# Patient Record
Sex: Male | Born: 1945 | Hispanic: No | Marital: Married | Smoking: Former smoker
Health system: Southern US, Community
[De-identification: ages and names within clinical notes are randomized; demographics above are authoritative.]

## PROBLEM LIST (undated history)

## (undated) DIAGNOSIS — R0600 Dyspnea, unspecified: Secondary | ICD-10-CM

---

## 2009-01-20 ENCOUNTER — Ambulatory Visit: Payer: Self-pay | Admitting: Internal Medicine

## 2009-02-11 ENCOUNTER — Ambulatory Visit: Payer: Self-pay | Admitting: Internal Medicine

## 2009-02-20 ENCOUNTER — Ambulatory Visit: Payer: Self-pay | Admitting: Internal Medicine

## 2009-03-22 ENCOUNTER — Ambulatory Visit: Payer: Self-pay | Admitting: Internal Medicine

## 2013-07-30 ENCOUNTER — Emergency Department: Payer: Self-pay | Admitting: Emergency Medicine

## 2013-07-30 LAB — CBC
HCT: 42 % (ref 40.0–52.0)
HGB: 14.6 g/dL (ref 13.0–18.0)
Platelet: 151 10*3/uL (ref 150–440)
RBC: 4.58 10*6/uL (ref 4.40–5.90)
WBC: 2.7 10*3/uL — ABNORMAL LOW (ref 3.8–10.6)

## 2013-07-30 LAB — BASIC METABOLIC PANEL
BUN: 12 mg/dL (ref 7–18)
Calcium, Total: 8.8 mg/dL (ref 8.5–10.1)
Co2: 30 mmol/L (ref 21–32)
EGFR (African American): >60
EGFR (Non-African Amer.): >60
Glucose: 121 mg/dL — ABNORMAL HIGH (ref 65–99)
Osmolality: 279 (ref 275–301)

## 2016-07-20 ENCOUNTER — Other Ambulatory Visit: Payer: Self-pay | Admitting: Family Medicine

## 2016-07-20 DIAGNOSIS — Z136 Encounter for screening for cardiovascular disorders: Secondary | ICD-10-CM

## 2016-07-27 ENCOUNTER — Ambulatory Visit: Payer: 59

## 2017-12-29 ENCOUNTER — Other Ambulatory Visit: Payer: Self-pay | Admitting: Family Medicine

## 2017-12-29 DIAGNOSIS — Z136 Encounter for screening for cardiovascular disorders: Secondary | ICD-10-CM

## 2020-09-11 ENCOUNTER — Other Ambulatory Visit: Payer: Self-pay

## 2020-09-11 ENCOUNTER — Other Ambulatory Visit (HOSPITAL_COMMUNITY): Payer: Self-pay | Admitting: Family Medicine

## 2020-09-11 ENCOUNTER — Other Ambulatory Visit: Payer: Self-pay | Admitting: Family Medicine

## 2020-09-11 ENCOUNTER — Ambulatory Visit
Admission: RE | Admit: 2020-09-11 | Discharge: 2020-09-11 | Disposition: A | Discharge status: Home / Self Care | Payer: BC Managed Care – PPO | Source: Ambulatory Visit | Attending: Family Medicine | Admitting: Family Medicine

## 2020-09-11 DIAGNOSIS — R918 Other nonspecific abnormal finding of lung field: Secondary | ICD-10-CM | POA: Diagnosis not present

## 2020-11-13 DIAGNOSIS — I209 Angina pectoris, unspecified: Secondary | ICD-10-CM | POA: Diagnosis present

## 2020-11-14 ENCOUNTER — Other Ambulatory Visit: Payer: BC Managed Care – PPO

## 2020-11-17 ENCOUNTER — Other Ambulatory Visit
Admission: RE | Admit: 2020-11-17 | Discharge: 2020-11-17 | Disposition: A | Discharge status: Home / Self Care | Payer: BC Managed Care – PPO | Source: Ambulatory Visit | Attending: Internal Medicine | Admitting: Internal Medicine

## 2020-11-17 ENCOUNTER — Other Ambulatory Visit: Payer: Self-pay

## 2020-11-17 DIAGNOSIS — Z79899 Other long term (current) drug therapy: Secondary | ICD-10-CM | POA: Diagnosis not present

## 2020-11-17 DIAGNOSIS — I2510 Atherosclerotic heart disease of native coronary artery without angina pectoris: Secondary | ICD-10-CM | POA: Diagnosis not present

## 2020-11-17 DIAGNOSIS — I110 Hypertensive heart disease with heart failure: Secondary | ICD-10-CM | POA: Diagnosis not present

## 2020-11-17 DIAGNOSIS — E7800 Pure hypercholesterolemia, unspecified: Secondary | ICD-10-CM | POA: Diagnosis not present

## 2020-11-17 DIAGNOSIS — Z01812 Encounter for preprocedural laboratory examination: Secondary | ICD-10-CM | POA: Insufficient documentation

## 2020-11-17 DIAGNOSIS — Z20822 Contact with and (suspected) exposure to covid-19: Secondary | ICD-10-CM | POA: Insufficient documentation

## 2020-11-17 DIAGNOSIS — E785 Hyperlipidemia, unspecified: Secondary | ICD-10-CM | POA: Diagnosis not present

## 2020-11-17 DIAGNOSIS — I420 Dilated cardiomyopathy: Secondary | ICD-10-CM | POA: Diagnosis not present

## 2020-11-17 DIAGNOSIS — I447 Left bundle-branch block, unspecified: Secondary | ICD-10-CM | POA: Diagnosis not present

## 2020-11-17 DIAGNOSIS — I5022 Chronic systolic (congestive) heart failure: Secondary | ICD-10-CM | POA: Diagnosis not present

## 2020-11-17 DIAGNOSIS — R0602 Shortness of breath: Secondary | ICD-10-CM | POA: Diagnosis not present

## 2020-11-17 DIAGNOSIS — Z87891 Personal history of nicotine dependence: Secondary | ICD-10-CM | POA: Diagnosis not present

## 2020-11-17 LAB — SARS CORONAVIRUS 2 (TAT 6-24 HRS): SARS Coronavirus 2: NEGATIVE

## 2020-11-18 ENCOUNTER — Ambulatory Visit
Admission: RE | Admit: 2020-11-18 | Discharge: 2020-11-18 | Disposition: A | Discharge status: Home / Self Care | Payer: BC Managed Care – PPO | Attending: Internal Medicine | Admitting: Internal Medicine

## 2020-11-18 ENCOUNTER — Encounter: Payer: Self-pay | Admitting: Internal Medicine

## 2020-11-18 ENCOUNTER — Encounter
Admission: RE | Disposition: A | Discharge status: Home / Self Care | Payer: Self-pay | Source: Home / Self Care | Attending: Internal Medicine

## 2020-11-18 ENCOUNTER — Other Ambulatory Visit: Payer: Self-pay

## 2020-11-18 DIAGNOSIS — I209 Angina pectoris, unspecified: Secondary | ICD-10-CM | POA: Diagnosis present

## 2020-11-18 DIAGNOSIS — I2510 Atherosclerotic heart disease of native coronary artery without angina pectoris: Secondary | ICD-10-CM | POA: Diagnosis not present

## 2020-11-18 DIAGNOSIS — R079 Chest pain, unspecified: Secondary | ICD-10-CM

## 2020-11-18 DIAGNOSIS — I110 Hypertensive heart disease with heart failure: Secondary | ICD-10-CM | POA: Insufficient documentation

## 2020-11-18 DIAGNOSIS — R0602 Shortness of breath: Secondary | ICD-10-CM | POA: Insufficient documentation

## 2020-11-18 DIAGNOSIS — Z20822 Contact with and (suspected) exposure to covid-19: Secondary | ICD-10-CM | POA: Insufficient documentation

## 2020-11-18 DIAGNOSIS — Z87891 Personal history of nicotine dependence: Secondary | ICD-10-CM | POA: Insufficient documentation

## 2020-11-18 DIAGNOSIS — Z79899 Other long term (current) drug therapy: Secondary | ICD-10-CM | POA: Insufficient documentation

## 2020-11-18 DIAGNOSIS — I420 Dilated cardiomyopathy: Secondary | ICD-10-CM | POA: Insufficient documentation

## 2020-11-18 DIAGNOSIS — E785 Hyperlipidemia, unspecified: Secondary | ICD-10-CM | POA: Insufficient documentation

## 2020-11-18 DIAGNOSIS — I5022 Chronic systolic (congestive) heart failure: Secondary | ICD-10-CM | POA: Insufficient documentation

## 2020-11-18 DIAGNOSIS — I447 Left bundle-branch block, unspecified: Secondary | ICD-10-CM | POA: Insufficient documentation

## 2020-11-18 DIAGNOSIS — R9430 Abnormal result of cardiovascular function study, unspecified: Secondary | ICD-10-CM

## 2020-11-18 DIAGNOSIS — E7800 Pure hypercholesterolemia, unspecified: Secondary | ICD-10-CM | POA: Insufficient documentation

## 2020-11-18 HISTORY — DX: Dyspnea, unspecified: R06.00

## 2020-11-18 HISTORY — PX: LEFT HEART CATH AND CORONARY ANGIOGRAPHY: CATH118249

## 2020-11-18 SURGERY — LEFT HEART CATH AND CORONARY ANGIOGRAPHY
Anesthesia: Moderate Sedation | Laterality: Left

## 2020-11-18 MED ORDER — LIDOCAINE HCL (PF) 1 % IJ SOLN
INTRAMUSCULAR | Status: AC
  Filled 2020-11-18: qty 30

## 2020-11-18 MED ORDER — HEPARIN (PORCINE) IN NACL 1000-0.9 UT/500ML-% IV SOLN
INTRAVENOUS | Status: DC | PRN
  Administered 2020-11-18: 500 mL

## 2020-11-18 MED ORDER — LIDOCAINE HCL (PF) 1 % IJ SOLN
INTRAMUSCULAR | Status: DC | PRN
  Administered 2020-11-18: 2 mL

## 2020-11-18 MED ORDER — VERAPAMIL HCL 2.5 MG/ML IV SOLN
INTRAVENOUS | Status: AC
  Filled 2020-11-18: qty 2

## 2020-11-18 MED ORDER — FENTANYL CITRATE (PF) 100 MCG/2ML IJ SOLN
INTRAMUSCULAR | Status: AC
  Filled 2020-11-18: qty 2

## 2020-11-18 MED ORDER — SODIUM CHLORIDE 0.9 % WEIGHT BASED INFUSION: 1.0000 mL/kg/h | INTRAVENOUS | Status: DC

## 2020-11-18 MED ORDER — MIDAZOLAM HCL 2 MG/2ML IJ SOLN
INTRAMUSCULAR | Status: DC | PRN
  Administered 2020-11-18 (×2): 1 mg via INTRAVENOUS

## 2020-11-18 MED ORDER — SODIUM CHLORIDE 0.9 % IV SOLN: 250.0000 mL | INTRAVENOUS | Status: DC | PRN

## 2020-11-18 MED ORDER — HEPARIN (PORCINE) IN NACL 1000-0.9 UT/500ML-% IV SOLN
INTRAVENOUS | Status: AC
  Filled 2020-11-18: qty 1000

## 2020-11-18 MED ORDER — HEPARIN SODIUM (PORCINE) 1000 UNIT/ML IJ SOLN
INTRAMUSCULAR | Status: AC
  Filled 2020-11-18: qty 1

## 2020-11-18 MED ORDER — SODIUM CHLORIDE 0.9% FLUSH: 3.0000 mL | INTRAVENOUS | Status: DC | PRN

## 2020-11-18 MED ORDER — IOHEXOL 300 MG/ML  SOLN
INTRAMUSCULAR | Status: DC | PRN
  Administered 2020-11-18: 13:00:00 100 mL

## 2020-11-18 MED ORDER — ASPIRIN 81 MG PO CHEW: 81.0000 mg | CHEWABLE_TABLET | ORAL | Status: DC

## 2020-11-18 MED ORDER — VERAPAMIL HCL 2.5 MG/ML IV SOLN
INTRAVENOUS | Status: DC | PRN
  Administered 2020-11-18: 2.5 mg via INTRA_ARTERIAL

## 2020-11-18 MED ORDER — ONDANSETRON HCL 4 MG/2ML IJ SOLN: 4.0000 mg | Freq: Four times a day (QID) | INTRAMUSCULAR | Status: DC | PRN

## 2020-11-18 MED ORDER — MIDAZOLAM HCL 2 MG/2ML IJ SOLN
INTRAMUSCULAR | Status: AC
  Filled 2020-11-18: qty 2

## 2020-11-18 MED ORDER — LABETALOL HCL 5 MG/ML IV SOLN: 10.0000 mg | INTRAVENOUS | Status: DC | PRN

## 2020-11-18 MED ORDER — FENTANYL CITRATE (PF) 100 MCG/2ML IJ SOLN
INTRAMUSCULAR | Status: DC | PRN
Start: 2020-11-18 — End: 2020-11-18
  Administered 2020-11-18: 25 ug via INTRAVENOUS

## 2020-11-18 MED ORDER — SODIUM CHLORIDE 0.9% FLUSH: 3.0000 mL | Freq: Two times a day (BID) | INTRAVENOUS | Status: DC

## 2020-11-18 MED ORDER — ACETAMINOPHEN 325 MG PO TABS: 650.0000 mg | ORAL_TABLET | ORAL | Status: DC | PRN

## 2020-11-18 MED ORDER — HYDRALAZINE HCL 20 MG/ML IJ SOLN: 10.0000 mg | INTRAMUSCULAR | Status: DC | PRN

## 2020-11-18 MED ORDER — HEPARIN SODIUM (PORCINE) 1000 UNIT/ML IJ SOLN
INTRAMUSCULAR | Status: DC | PRN
  Administered 2020-11-18: 2500 [IU] via INTRAVENOUS

## 2020-11-18 MED ORDER — SODIUM CHLORIDE 0.9 % WEIGHT BASED INFUSION
3.0000 mL/kg/h | INTRAVENOUS | Status: AC
  Administered 2020-11-18: 12:00:00 3 mL/kg/h via INTRAVENOUS

## 2020-11-18 MED ORDER — ASPIRIN 81 MG PO CHEW
CHEWABLE_TABLET | ORAL | Status: AC
  Filled 2020-11-18: qty 1

## 2020-11-18 SURGICAL SUPPLY — 9 items
CATH INFINITI 5 FR JL3.5 (CATHETERS) ×3 IMPLANT
CATH INFINITI JR4 5F (CATHETERS) ×3 IMPLANT
DEVICE RAD TR BAND REGULAR (VASCULAR PRODUCTS) ×6 IMPLANT
GLIDESHEATH SLEND SS 6F .021 (SHEATH) ×3 IMPLANT
GUIDEWIRE INQWIRE 1.5J.035X260 (WIRE) ×1 IMPLANT
INQWIRE 1.5J .035X260CM (WIRE) ×3
KIT MANI 3VAL PERCEP (MISCELLANEOUS) ×3 IMPLANT
PACK CARDIAC CATH (CUSTOM PROCEDURE TRAY) ×3 IMPLANT
WIRE HITORQ VERSACORE ST 145CM (WIRE) ×3 IMPLANT

## 2020-11-18 NOTE — Discharge Instructions (Signed)
Radial Site Care  This sheet gives you information about how to care for yourself after your procedure. Your health care provider may also give you more specific instructions. If you have problems or questions, contact your health care provider. What can I expect after the procedure? After the procedure, it is common to have:  Bruising and tenderness at the catheter insertion area. Follow these instructions at home: Medicines  Take over-the-counter and prescription medicines only as told by your health care provider. Insertion site care  Follow instructions from your health care provider about how to take care of your insertion site. Make sure you: ? Wash your hands with soap and water before you change your bandage (dressing). If soap and water are not available, use hand sanitizer. ? Change your dressing as told by your health care provider. ? Leave stitches (sutures), skin glue, or adhesive strips in place. These skin closures may need to stay in place for 2 weeks or longer. If adhesive strip edges start to loosen and curl up, you may trim the loose edges. Do not remove adhesive strips completely unless your health care provider tells you to do that.  Check your insertion site every day for signs of infection. Check for: ? Redness, swelling, or pain. ? Fluid or blood. ? Pus or a bad smell. ? Warmth.  Do not take baths, swim, or use a hot tub until your health care provider approves.  You may shower 24-48 hours after the procedure, or as directed by your health care provider. ? Remove the dressing and gently wash the site with plain soap and water. ? Pat the area dry with a clean towel. ? Do not rub the site. That could cause bleeding.  Do not apply powder or lotion to the site. Activity   For 24 hours after the procedure, or as directed by your health care provider: ? Do not flex or bend the affected arm. ? Do not push or pull heavy objects with the affected arm. ? Do not  drive yourself home from the hospital or clinic. You may drive 24 hours after the procedure unless your health care provider tells you not to. ? Do not operate machinery or power tools.  Do not lift anything that is heavier than 10 lb (4.5 kg), or the limit that you are told, until your health care provider says that it is safe.  Ask your health care provider when it is okay to: ? Return to work or school. ? Resume usual physical activities or sports. ? Resume sexual activity. General instructions  If the catheter site starts to bleed, raise your arm and put firm pressure on the site. If the bleeding does not stop, get help right away. This is a medical emergency.  If you went home on the same day as your procedure, a responsible adult should be with you for the first 24 hours after you arrive home.  Keep all follow-up visits as told by your health care provider. This is important. Contact a health care provider if:  You have a fever.  You have redness, swelling, or yellow drainage around your insertion site. Get help right away if:  You have unusual pain at the radial site.  The catheter insertion area swells very fast.  The insertion area is bleeding, and the bleeding does not stop when you hold steady pressure on the area.  Your arm or hand becomes pale, cool, tingly, or numb. These symptoms may represent a serious problem   that is an emergency. Do not wait to see if the symptoms will go away. Get medical help right away. Call your local emergency services (911 in the [place] Do not drive yourself to the hospital. Summary  After the procedure, it is common to have bruising and tenderness at the site.  Follow instructions from your health care provider about how to take care of your radial site wound. Check the wound every day for signs of infection.  Do not lift anything that is heavier than 10 lb (4.5 kg), or the limit that you are told, until your health care provider says  that it is safe. This information is not intended to replace advice given to you by your health care provider. Make sure you discuss any questions you have with your health care provider. Document Revised: 12/14/2017 Document Reviewed: 12/14/2017 Elsevier Patient Education  2020 [employer] Angiogram, Care After This sheet gives you information about how to care for yourself after your procedure. Your health care provider may also give you more specific instructions. If you have problems or questions, contact your health care provider. What can I expect after the procedure? After the procedure, it is common to have bruising and tenderness at the catheter insertion area. Follow these instructions at home: Insertion site care  Follow instructions from your health care provider about how to take care of your insertion site. Make sure you: ? Wash your hands with soap and water before you change your bandage (dressing). If soap and water are not available, use hand sanitizer. ? Change your dressing as told by your health care provider. ? Leave stitches (sutures), skin glue, or adhesive strips in place. These skin closures may need to stay in place for 2 weeks or longer. If adhesive strip edges start to loosen and curl up, you may trim the loose edges. Do not remove adhesive strips completely unless your health care provider tells you to do that.  Do not take baths, swim, or use a hot tub until your health care provider approves.  You may shower 24-48 hours after the procedure or as told by your health care provider. ? Gently wash the site with plain soap and water. ? Pat the area dry with a clean towel. ? Do not rub the site. This may cause bleeding.  Do not apply powder or lotion to the site. Keep the site clean and dry.  Check your insertion site every day for signs of infection. Check for: ? Redness, swelling, or pain. ? Fluid or blood. ? Warmth. ? Pus or a bad smell. Activity  Rest  as told by your health care provider, usually for 1-2 days.  Do not lift anything that is heavier than 10 lbs. (4.5 kg) or as told by your health care provider.  Do not drive for 24 hours if you were given a medicine to help you relax (sedative).  Do not drive or use heavy machinery while taking prescription pain medicine. General instructions   Return to your normal activities as told by your health care provider, usually in about a week. Ask your health care provider what activities are safe for you.  If the catheter site starts bleeding, lie flat and put pressure on the site. If the bleeding does not stop, get help right away. This is a medical emergency.  Drink enough fluid to keep your urine clear or pale yellow. This helps flush the contrast dye from your body.  Take over-the-counter and prescription medicines only   as told by your health care provider.  Keep all follow-up visits as told by your health care provider. This is important. Contact a health care provider if:  You have a fever or chills.  You have redness, swelling, or pain around your insertion site.  You have fluid or blood coming from your insertion site.  The insertion site feels warm to the touch.  You have pus or a bad smell coming from your insertion site.  You have bruising around the insertion site.  You notice blood collecting in the tissue around the catheter site (hematoma). The hematoma may be painful to the touch. Get help right away if:  You have severe pain at the catheter insertion area.  The catheter insertion area swells very fast.  The catheter insertion area is bleeding, and the bleeding does not stop when you hold steady pressure on the area.  The area near or just beyond the catheter insertion site becomes pale, cool, tingly, or numb. These symptoms may represent a serious problem that is an emergency. Do not wait to see if the symptoms will go away. Get medical help right away. Call  your local emergency services (911 in the [place] Do not drive yourself to the hospital. Summary  After the procedure, it is common to have bruising and tenderness at the catheter insertion area.  After the procedure, it is important to rest and drink plenty of fluids.  Do not take baths, swim, or use a hot tub until your health care provider says it is okay to do so. You may shower 24-48 hours after the procedure or as told by your health care provider.  If the catheter site starts bleeding, lie flat and put pressure on the site. If the bleeding does not stop, get help right away. This is a medical emergency. This information is not intended to replace advice given to you by your health care provider. Make sure you discuss any questions you have with your health care provider. Document Revised: 10/21/2017 Document Reviewed: 10/13/2016 Elsevier Patient Education  2020 [employer]   

## 2020-11-19 ENCOUNTER — Encounter: Payer: Self-pay | Admitting: Internal Medicine

## 2022-09-18 IMAGING — CT CT CHEST W/O CM
2 of 3 series · 15 of 36 positions shown, 18 images · non-contrast
Comparison: Chest radiographs dated 07/30/2013

CLINICAL DATA: Cough and hemoptysis. Fifty pack-year history of
smoking, quit approximately 8 years ago. Reported chest x-ray
showing an irregularly marginated nodule in the right lung. That
radiograph and report are not available comparison.

EXAM:
CT CHEST WITHOUT CONTRAST
TECHNIQUE: Multidetector CT imaging of the chest was performed following the
standard protocol without IV contrast.

[Series 2: thorax · axial · 0.59mm/px · z∈[-559,-287]mm · 12 of 160 slices shown, 15 images]
[im 12/160  mediastinal]
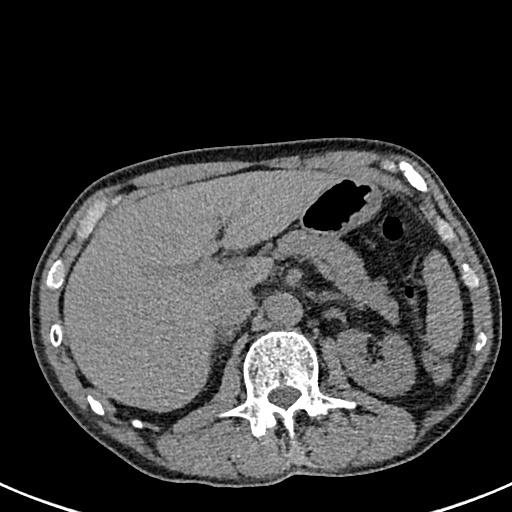
[im 12/160  lung]
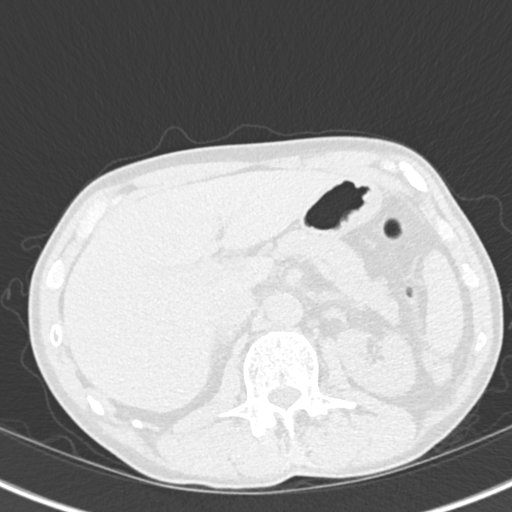
[im 24/160  lung]
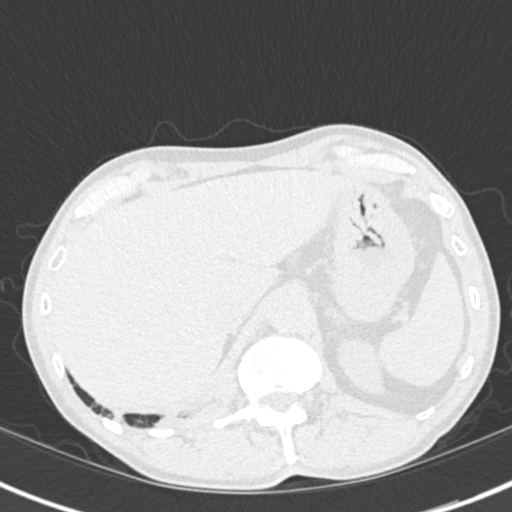
[im 36/160  lung]
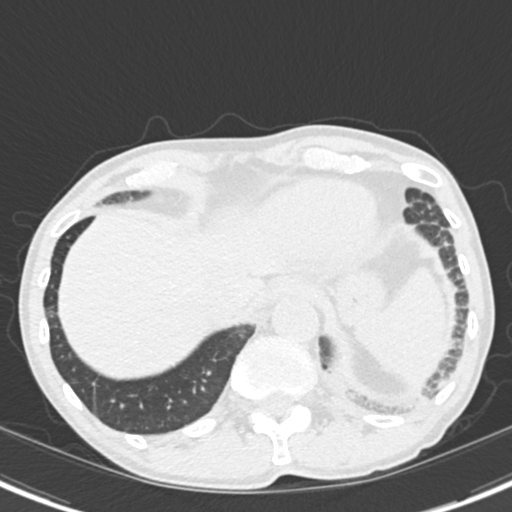
[im 48/160  lung]
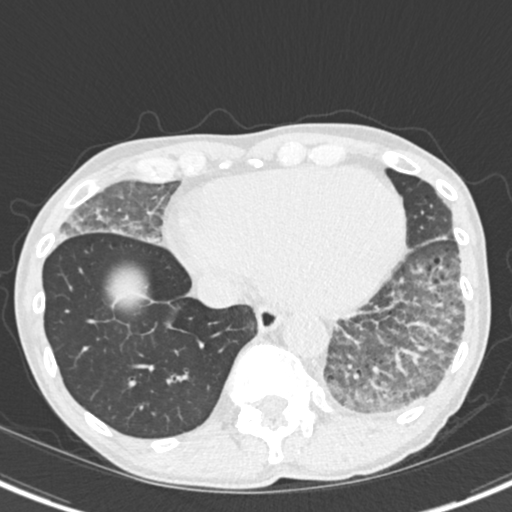
[im 59/160  mediastinal]
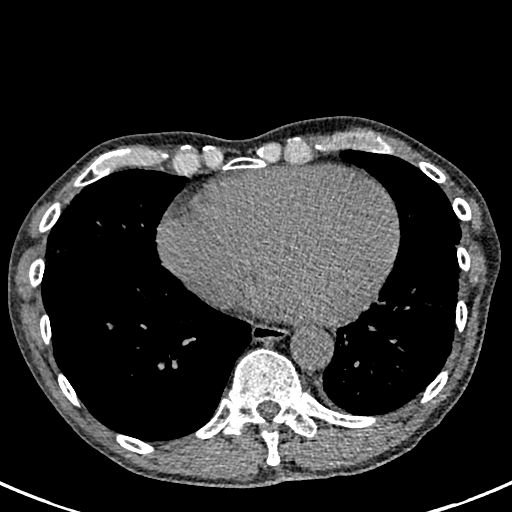
[im 59/160  lung]
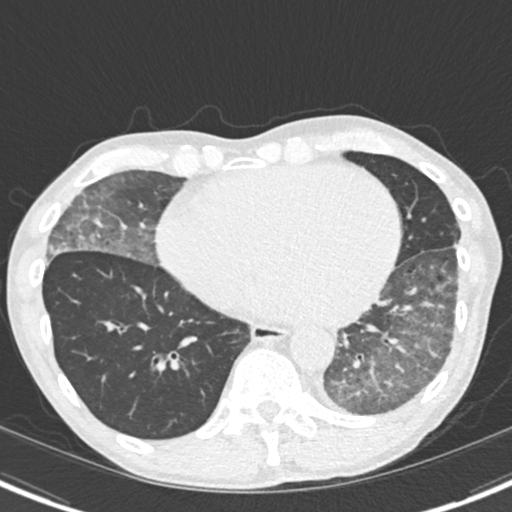
[im 71/160  lung]
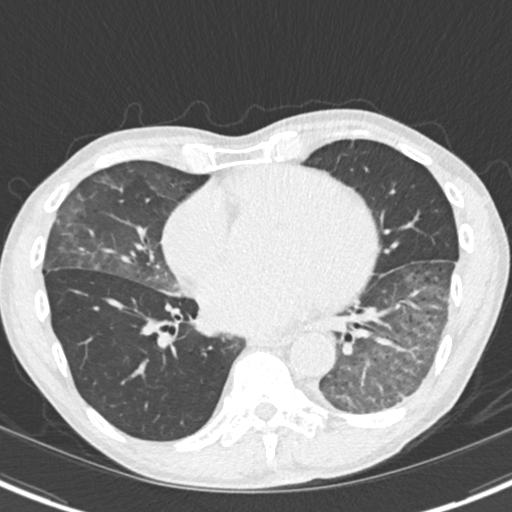
[im 89/160  lung]
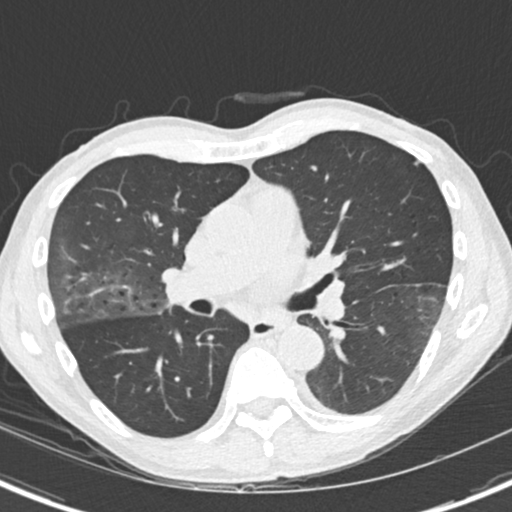
[im 101/160  lung]
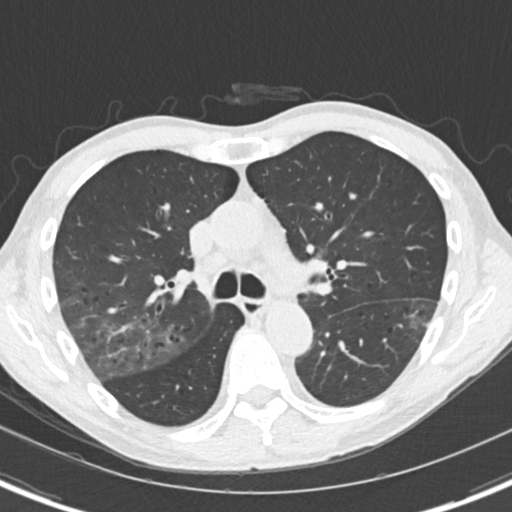
[im 112/160  mediastinal]
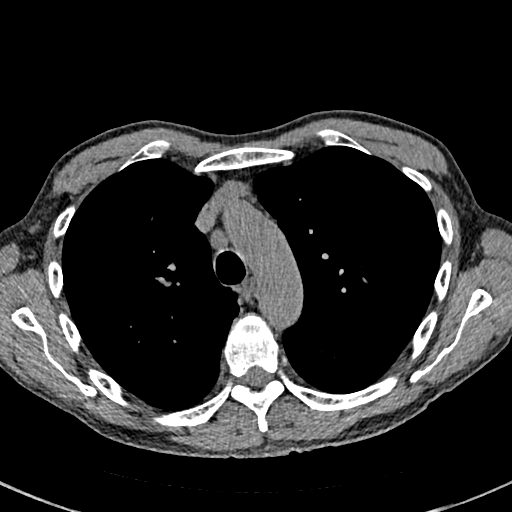
[im 112/160  lung]
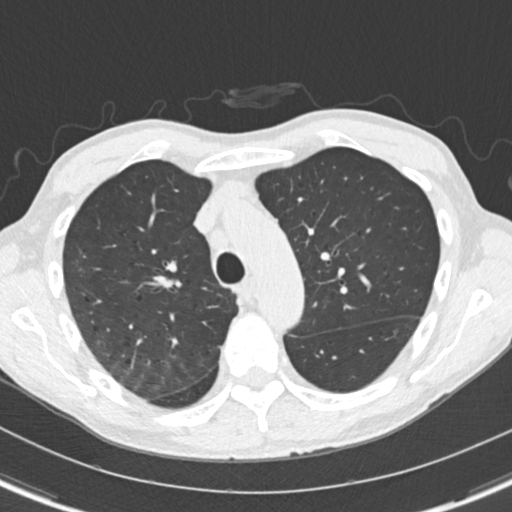
[im 124/160  lung]
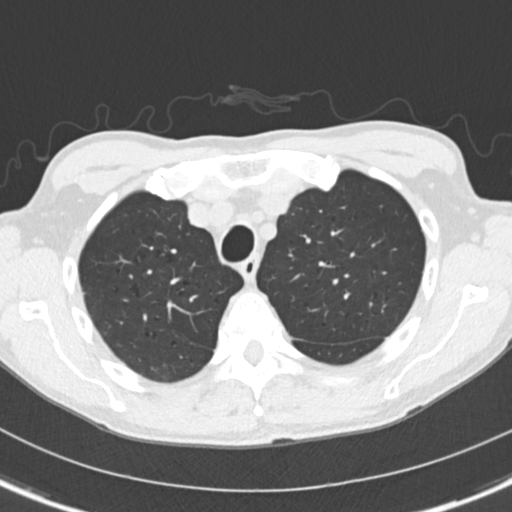
[im 136/160  lung]
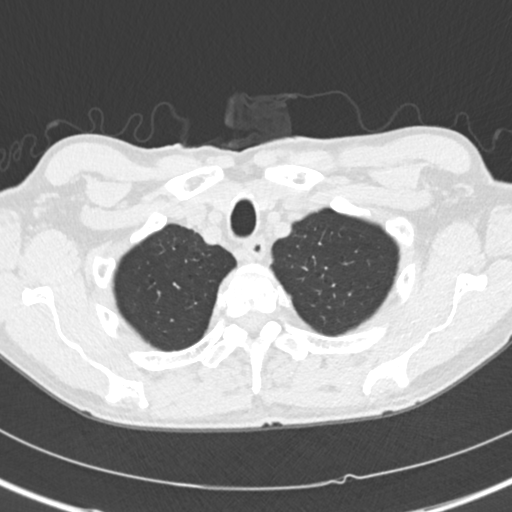
[im 148/160  lung]
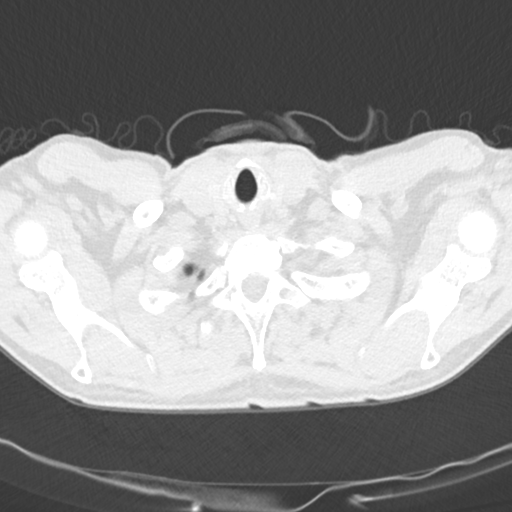

[Series 5: coronal · coronal · 0.62mm/px · 3 of 110 slices shown]
[im 22/110  lung]
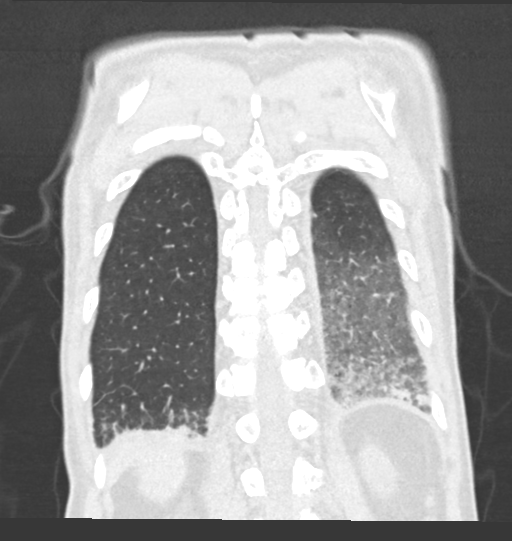
[im 44/110  lung]
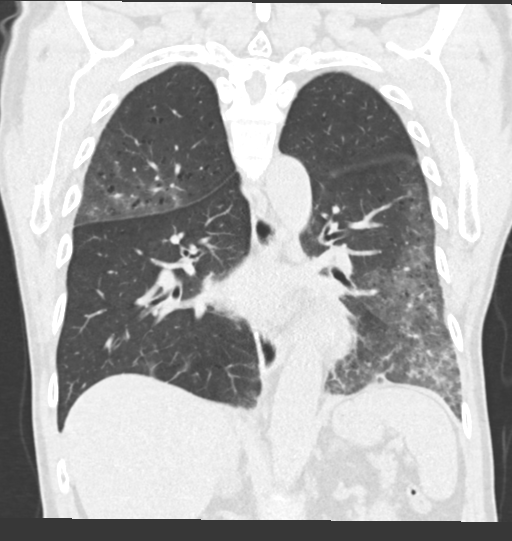
[im 66/110  lung]
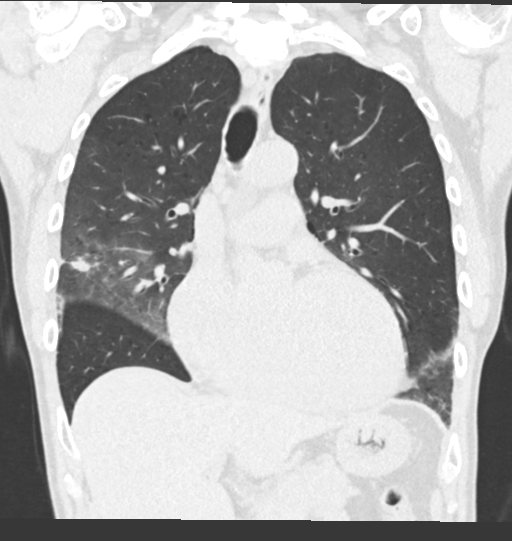

[15 of 36 positions shown; findings below may reference images not displayed]

FINDINGS: Cardiovascular: Enlarged heart. Small amount of aortic and proximal
left anterior descending coronary artery calcification.

Mediastinum/Nodes: Multiple mildly enlarged mediastinal nodes. These
include:

A 10 mm short axis right precarinal node on image number 60 series
2.

10 mm short axis right pretracheal node on image number 48 series 2.

15 mm short axis subcarinal node on image number 68 series 2.

No enlarged hilar nodes are visualized. No enlarged axillary or
upper abdominal nodes.

Normal appearing thyroid gland.

Lungs/Pleura: Mild bilateral centrilobular bullous changes. Mild
diffuse peribronchial thickening. Patchy interstitial opacities in
the right upper lobe, right middle lobe and left lower lobe
including ground-glass opacities and more confluent densities. These
changes are most pronounced in the left lower lobe.

In the region of the posterior aspect of the minor fissure,
involving the right middle lobe and adjacent right upper lobe, there
is a mass-like density with irregular and nodular components,
measuring 3.6 x 1.0 cm on coronal image number 47 series 5 and
cm in length on sagittal image number 18 series 6. In the axial
plane, this is best seen on image number 82 series 3.

Minimal left pleural fluid.

Upper Abdomen: Atheromatous abdominal aortic calcification.

Musculoskeletal: Mild thoracic spine degenerative changes.
IMPRESSION: 1. 3.6 x 1.0 x 1.0 cm mass-like density with irregular and nodular
components in the region of the posterior aspect of the minor
fissure, involving the right middle lobe and adjacent right upper
lobe. Differential considerations include confluent
infection/atelectasis and malignancy. If the patient has clinical
signs and symptoms of infection, a repeat chest CT without contrast
in 3 months would be recommended, following appropriate treatment.
If there are no clinical signs and symptoms of infection, a PET-CT
may provide additional information at this time.
2. Patchy interstitial opacities in the right upper lobe, right
middle lobe and left lower lobe including ground-glass opacities and
more confluent densities. These changes are most pronounced in the
left lower lobe and have an appearance most compatible with
infectious pneumonitis/pneumonia.
3. Minimal left pleural fluid.
4. Mild mediastinal adenopathy, most likely reactive.
5. Mild changes of COPD and chronic bronchitis.
6. Cardiomegaly.
7. Mild calcific coronary artery and aortic atherosclerosis.
8. Emphysema.

Aortic Atherosclerosis (AUFBP-9QB.B) and Emphysema (AUFBP-SAV.6).

## 2023-02-21 ENCOUNTER — Other Ambulatory Visit: Payer: Self-pay | Admitting: Family Medicine

## 2023-02-21 DIAGNOSIS — I2510 Atherosclerotic heart disease of native coronary artery without angina pectoris: Secondary | ICD-10-CM

## 2023-07-01 ENCOUNTER — Emergency Department: Payer: Medicare HMO

## 2023-07-01 ENCOUNTER — Emergency Department
Admission: EM | Admit: 2023-07-01 | Discharge: 2023-07-01 | Disposition: A | Discharge status: Home / Self Care | Payer: Medicare HMO | Attending: Student in an Organized Health Care Education/Training Program | Admitting: Student in an Organized Health Care Education/Training Program

## 2023-07-01 ENCOUNTER — Other Ambulatory Visit: Payer: Self-pay

## 2023-07-01 DIAGNOSIS — R519 Headache, unspecified: Secondary | ICD-10-CM | POA: Insufficient documentation

## 2023-07-01 DIAGNOSIS — R197 Diarrhea, unspecified: Secondary | ICD-10-CM

## 2023-07-01 DIAGNOSIS — E860 Dehydration: Secondary | ICD-10-CM | POA: Diagnosis not present

## 2023-07-01 DIAGNOSIS — Z20822 Contact with and (suspected) exposure to covid-19: Secondary | ICD-10-CM | POA: Insufficient documentation

## 2023-07-01 DIAGNOSIS — R112 Nausea with vomiting, unspecified: Secondary | ICD-10-CM | POA: Insufficient documentation

## 2023-07-01 LAB — CBC
HCT: 43.2 % (ref 39.0–52.0)
Hemoglobin: 14.8 g/dL (ref 13.0–17.0)
MCH: 30.7 pg (ref 26.0–34.0)
MCHC: 34.3 g/dL (ref 30.0–36.0)
MCV: 89.6 fL (ref 80.0–100.0)
Platelets: 187 10*3/uL (ref 150–400)
RBC: 4.82 MIL/uL (ref 4.22–5.81)
RDW: 12 % (ref 11.5–15.5)
WBC: 3.9 10*3/uL — ABNORMAL LOW (ref 4.0–10.5)
nRBC: 0 % (ref 0.0–0.2)

## 2023-07-01 LAB — URINALYSIS, ROUTINE W REFLEX MICROSCOPIC
Bilirubin Urine: NEGATIVE
Glucose, UA: NEGATIVE mg/dL
Hgb urine dipstick: NEGATIVE
Ketones, ur: 5 mg/dL — AB
Leukocytes,Ua: NEGATIVE
Nitrite: NEGATIVE
Protein, ur: NEGATIVE mg/dL
Specific Gravity, Urine: 1.008 (ref 1.005–1.030)
pH: 6 (ref 5.0–8.0)

## 2023-07-01 LAB — COMPREHENSIVE METABOLIC PANEL
ALT: 44 U/L (ref 0–44)
AST: 33 U/L (ref 15–41)
Albumin: 4.4 g/dL (ref 3.5–5.0)
Alkaline Phosphatase: 61 U/L (ref 38–126)
Anion gap: 9 (ref 5–15)
BUN: 9 mg/dL (ref 8–23)
CO2: 22 mmol/L (ref 22–32)
Calcium: 9.2 mg/dL (ref 8.9–10.3)
Chloride: 104 mmol/L (ref 98–111)
Creatinine, Ser: 0.92 mg/dL (ref 0.61–1.24)
GFR, Estimated: >60 mL/min (ref >60)
Glucose, Bld: 140 mg/dL — ABNORMAL HIGH (ref 70–99)
Potassium: 3.6 mmol/L (ref 3.5–5.1)
Sodium: 135 mmol/L (ref 135–145)
Total Bilirubin: 0.9 mg/dL (ref 0.3–1.2)
Total Protein: 7.7 g/dL (ref 6.5–8.1)

## 2023-07-01 LAB — LIPASE, BLOOD: Lipase: 41 U/L (ref 11–51)

## 2023-07-01 LAB — ETHANOL: Alcohol, Ethyl (B): <10 mg/dL (ref <10)

## 2023-07-01 LAB — SARS CORONAVIRUS 2 BY RT PCR: SARS Coronavirus 2 by RT PCR: NEGATIVE

## 2023-07-01 MED ORDER — SODIUM CHLORIDE 0.9 % IV BOLUS
1000.0000 mL | Freq: Once | INTRAVENOUS | Status: AC
  Administered 2023-07-01: 1000 mL via INTRAVENOUS

## 2023-07-01 MED ORDER — ONDANSETRON HCL 4 MG/2ML IJ SOLN
4.0000 mg | Freq: Once | INTRAMUSCULAR | Status: AC
  Administered 2023-07-01: 4 mg via INTRAVENOUS
  Filled 2023-07-01: qty 2

## 2023-07-01 MED ORDER — MECLIZINE HCL 25 MG PO TABS
12.5000 mg | ORAL_TABLET | Freq: Once | ORAL | Status: AC
  Administered 2023-07-01: 12.5 mg via ORAL
  Filled 2023-07-01: qty 1

## 2023-07-01 MED ORDER — ACETAMINOPHEN 325 MG PO TABS
650.0000 mg | ORAL_TABLET | Freq: Once | ORAL | Status: AC
  Administered 2023-07-01: 650 mg via ORAL
  Filled 2023-07-01: qty 2

## 2023-07-01 NOTE — ED Provider Notes (Signed)
[facility] Provider Note    Event Date/Time   First MD Initiated Contact with Patient 07/01/23 1506     (approximate)   History   Emesis   HPI  Marc Reed is a 77 y.o. male presents to the ER for evaluation of nausea vomiting headache diarrhea that started this morning.  States he was feeling well yesterday.  Denies any chest pain.  States the diarrhea is watery.  No melena no hematochezia.  No measured fevers has felt weak and dizzy.  States that he feels like when he lays back the room is spinning but when he sitting up he can turn his head and at rest he does not feel like there is any dizziness or spinning sensation.     Physical Exam   Triage Vital Signs: ED Triage Vitals [07/01/23 1344]  Encounter Vitals Group     BP (!) 173/90     Systolic BP Percentile      Diastolic BP Percentile      Pulse Rate 79     Resp 18     Temp 97.7 F (36.5 C)     Temp src      SpO2 100 %     Weight 130 lb (59 kg)     Height 5\' 5"  (1.651 m)     Head Circumference      Peak Flow      Pain Score 0     Pain Loc      Pain Education      Exclude from Growth Chart     Most recent vital signs: Vitals:   07/01/23 2000 07/01/23 2030  BP: (!) 159/85 (!) 160/85  Pulse: 67 64  Resp: 16 18  Temp:    SpO2: 100% 100%     Constitutional: Alert  Eyes: Conjunctivae are normal.  Head: Atraumatic. Nose: No congestion/rhinnorhea. Mouth/Throat: Mucous membranes are moist.   Neck: Painless ROM.  Cardiovascular:   Good peripheral circulation. Respiratory: Normal respiratory effort.  No retractions.  Gastrointestinal: Soft and nontender.  Musculoskeletal:  no deformity Neurologic:  MAE spontaneously. No gross focal neurologic deficits are appreciated.  Skin:  Skin is warm, dry and intact. No rash noted. Psychiatric: Mood and affect are normal. Speech and behavior are normal.    ED Results / Procedures / Treatments   Labs (all labs ordered are listed, but  only abnormal results are displayed) Labs Reviewed  COMPREHENSIVE METABOLIC PANEL - Abnormal; Notable for the following components:      Result Value   Glucose, Bld 140 (*)    All other components within normal limits  CBC - Abnormal; Notable for the following components:   WBC 3.9 (*)    All other components within normal limits  URINALYSIS, ROUTINE W REFLEX MICROSCOPIC - Abnormal; Notable for the following components:   Color, Urine STRAW (*)    APPearance CLEAR (*)    Ketones, ur 5 (*)    All other components within normal limits  SARS CORONAVIRUS 2 BY RT PCR  LIPASE, BLOOD  ETHANOL     EKG  ED ECG REPORT I, Willy Eddy, the attending physician, personally viewed and interpreted this ECG.   Date: 07/01/2023  EKG Time: 13:46  Rate: 80  Rhythm: sinus  Axis: normal  Intervals: lbbb  ST&T Change: no stemi, no depressions    RADIOLOGY Please see ED Course for my review and interpretation.  I personally reviewed all radiographic images ordered to evaluate for  the above acute complaints and reviewed radiology reports and findings.  These findings were personally discussed with the patient.  Please see medical record for radiology report.    PROCEDURES:  Critical Care performed: No  Procedures   MEDICATIONS ORDERED IN ED: Medications  sodium chloride 0.9 % bolus 1,000 mL (0 mLs Intravenous Stopped 07/01/23 1850)  ondansetron (ZOFRAN) injection 4 mg (4 mg Intravenous Given 07/01/23 1542)  acetaminophen (TYLENOL) tablet 650 mg (650 mg Oral Given 07/01/23 1844)  meclizine (ANTIVERT) tablet 12.5 mg (12.5 mg Oral Given 07/01/23 1845)     IMPRESSION / MDM / ASSESSMENT AND PLAN / ED COURSE  I reviewed the triage vital signs and the nursing notes.                              Differential diagnosis includes, but is not limited to, hydration, electrolyte abnormality, CVA, ICH, vertigo, enteritis,  Patient presenting to the ER for evaluation of symptoms as described  above.  Based on symptoms, risk factors and considered above differential, this presenting complaint could reflect a potentially life-threatening illness therefore the patient will be placed on continuous pulse oximetry and telemetry for monitoring.  Laboratory evaluation will be sent to evaluate for the above complaints.      Clinical Course as of 07/01/23 2034  Caleen Essex Jul 01, 2023  1537 Chest x-ray on my review and interpretation does not show any evidence of consolidation or pneumothorax. [PR]  2031 Patient reassessed.  Feels significant improved after IV fluids and symptomatic management.  He is tolerating p.o.  Not orthostatic.  Able to stand without any dizziness.  Is not consistent with CVA.  Not having any additional diarrheal episodes.  Repeat abdominal exam soft and benign.  Suspect some sort of viral illness.  Given his reassuring workup and improvement in symptoms I do believe he is stable and appropriate for outpatient follow-up. [PR]    Clinical Course User Index [PR] Willy Eddy, MD     FINAL CLINICAL IMPRESSION(S) / ED DIAGNOSES   Final diagnoses:  Dehydration  Diarrhea, unspecified type     Rx / DC Orders   ED Discharge Orders     None        Note:  This document was prepared using Dragon voice recognition software and may include unintentional dictation errors.    Willy Eddy, MD 07/01/23 2034

## 2023-07-01 NOTE — ED Triage Notes (Signed)
Pt to ED [facility] from home for n/v/d started this am. Dizziness with moving head.  Cbg 113 Bp 188/90, has not had meds yet today 20g to R FA, 4mg  zofran PTA

## 2023-07-01 NOTE — ED Notes (Signed)
Gave pt ginger ale and saltines for PO challenge

## 2023-07-01 NOTE — ED Notes (Incomplete)
Pt up to the

## 2023-07-01 NOTE — ED Notes (Signed)
Pt up to the restroom with a steady gait. Pt reports improvement on dizziness and nausea

## 2023-07-01 NOTE — ED Notes (Signed)
Pt to CT

## 2023-07-01 NOTE — ED Triage Notes (Signed)
Pt states drinks a shot every other day. Last drink yesterday
